# Patient Record
Sex: Female | Born: 1970 | Race: White | Hispanic: No | Marital: Single | Smoking: Never smoker
Health system: Southern US, Community
[De-identification: ages and names within clinical notes are randomized; demographics above are authoritative.]

## PROBLEM LIST (undated history)

## (undated) DIAGNOSIS — J45909 Unspecified asthma, uncomplicated: Secondary | ICD-10-CM

## (undated) DIAGNOSIS — I10 Essential (primary) hypertension: Secondary | ICD-10-CM

---

## 1998-05-17 ENCOUNTER — Emergency Department (HOSPITAL_COMMUNITY): Admission: EM | Admit: 1998-05-17 | Discharge: 1998-05-17 | Payer: Self-pay | Admitting: Emergency Medicine

## 1998-05-31 ENCOUNTER — Emergency Department (HOSPITAL_COMMUNITY): Admission: EM | Admit: 1998-05-31 | Discharge: 1998-05-31 | Payer: Self-pay | Admitting: Emergency Medicine

## 1999-05-19 ENCOUNTER — Emergency Department (HOSPITAL_COMMUNITY): Admission: EM | Admit: 1999-05-19 | Discharge: 1999-05-19 | Payer: Self-pay

## 2001-02-10 ENCOUNTER — Encounter: Payer: Self-pay | Admitting: Emergency Medicine

## 2001-02-10 ENCOUNTER — Emergency Department (HOSPITAL_COMMUNITY): Admission: EM | Admit: 2001-02-10 | Discharge: 2001-02-10 | Payer: Self-pay | Admitting: Emergency Medicine

## 2001-08-30 ENCOUNTER — Emergency Department (HOSPITAL_COMMUNITY): Admission: EM | Admit: 2001-08-30 | Discharge: 2001-08-30 | Payer: Self-pay

## 2002-03-11 ENCOUNTER — Encounter: Payer: Self-pay | Admitting: Emergency Medicine

## 2002-03-11 ENCOUNTER — Emergency Department (HOSPITAL_COMMUNITY): Admission: EM | Admit: 2002-03-11 | Discharge: 2002-03-11 | Payer: Self-pay | Admitting: Emergency Medicine

## 2008-03-01 ENCOUNTER — Emergency Department (HOSPITAL_COMMUNITY): Admission: EM | Admit: 2008-03-01 | Discharge: 2008-03-01 | Payer: Self-pay | Admitting: Emergency Medicine

## 2009-06-26 ENCOUNTER — Emergency Department (HOSPITAL_COMMUNITY): Admission: EM | Admit: 2009-06-26 | Discharge: 2009-06-27 | Payer: Self-pay | Admitting: Emergency Medicine

## 2009-11-06 IMAGING — US US OB COMP LESS 14 WK
1 series · 14 of 28 positions shown · non-contrast
Comparison: none

CLINICAL DATA: Cramping

OBSTETRIC <14 WK ULTRASOUND
TECHNIQUE: Transabdominal ultrasound was performed for evaluation
of the gestation as well as the maternal uterus and adnexal
regions.
A single intrauterine gestational sac with yolk sac and embryonic.
No cardiac activity.
Mean sac diameter equals 2.9 mm.  Estimated gestational age equals
8 weeks 0 days.
Maternal uterus/adnexae:
Normal adnexa.  No subchorionic hemorrhage.

[Series 1: unknown · 0.30mm/px · 14 of 40 slices shown]
[im 2/40]
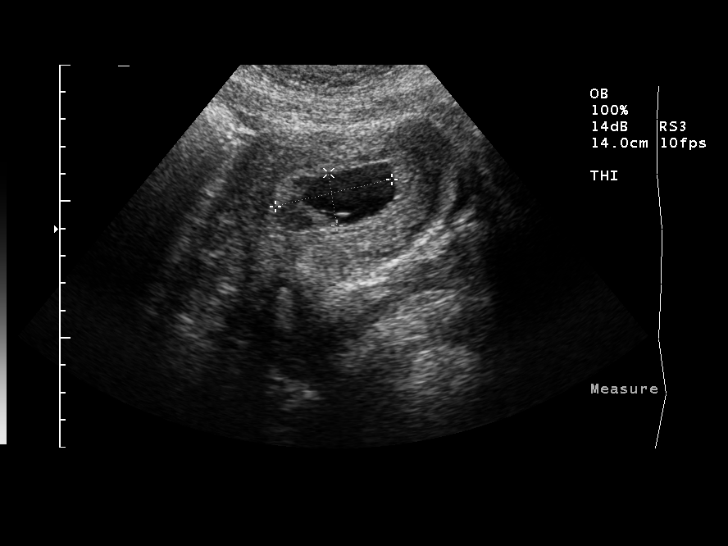
[im 5/40]
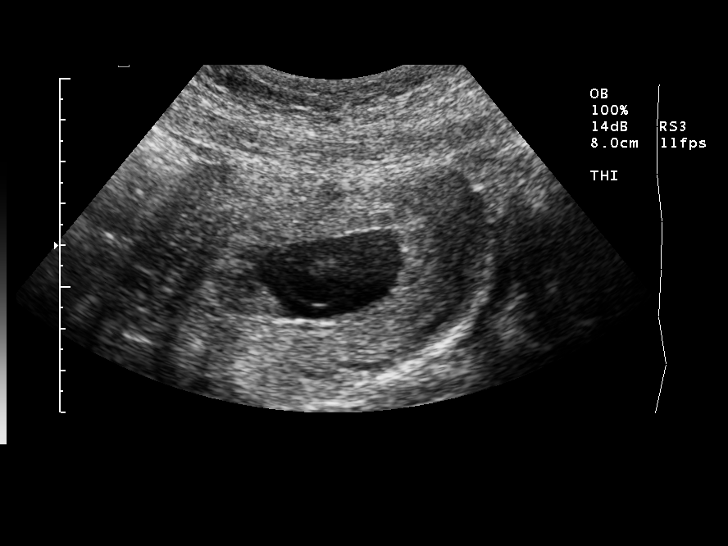
[im 8/40]
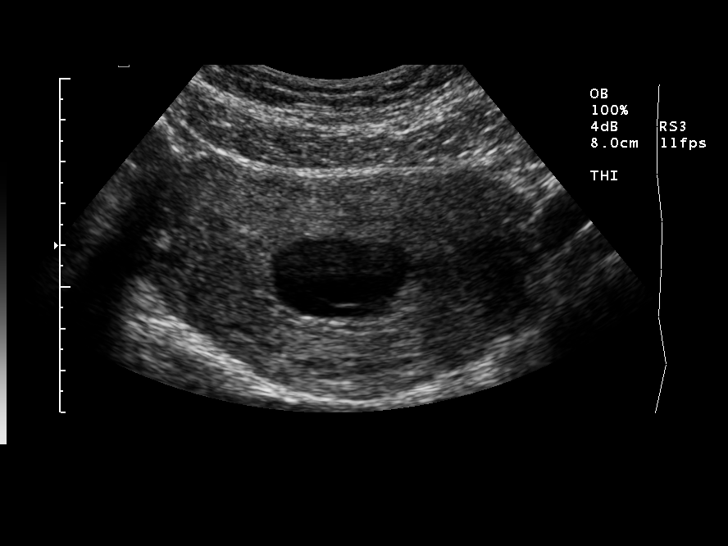
[im 11/40]
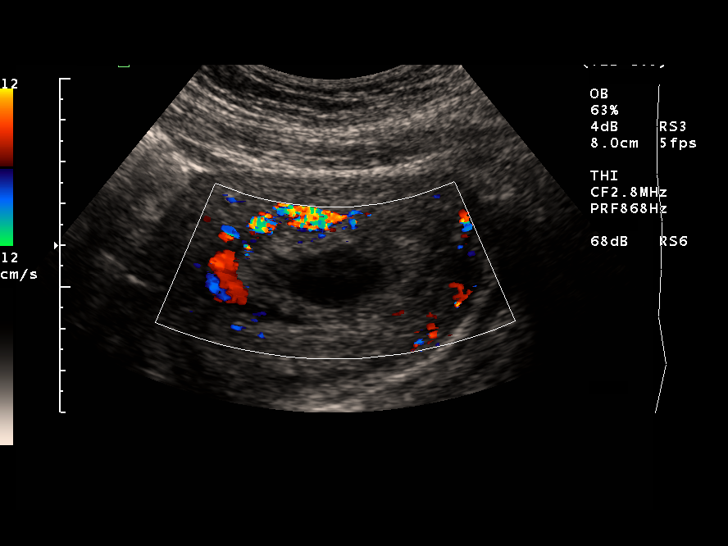
[im 14/40]
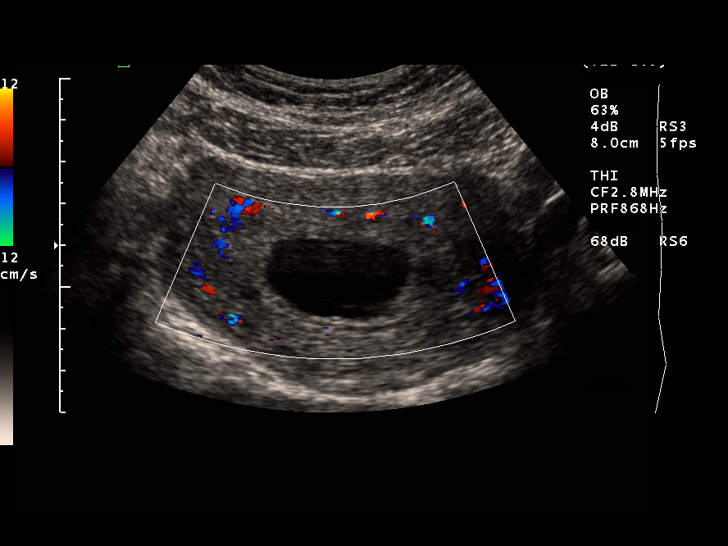
[im 16/40]
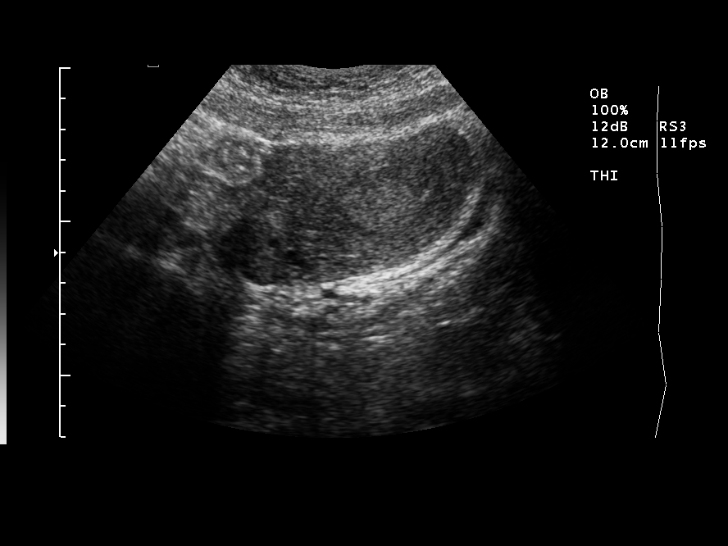
[im 19/40]
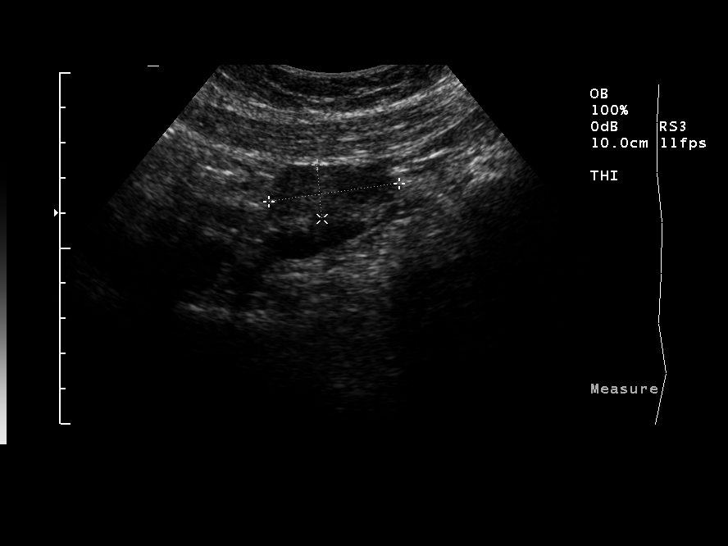
[im 22/40]
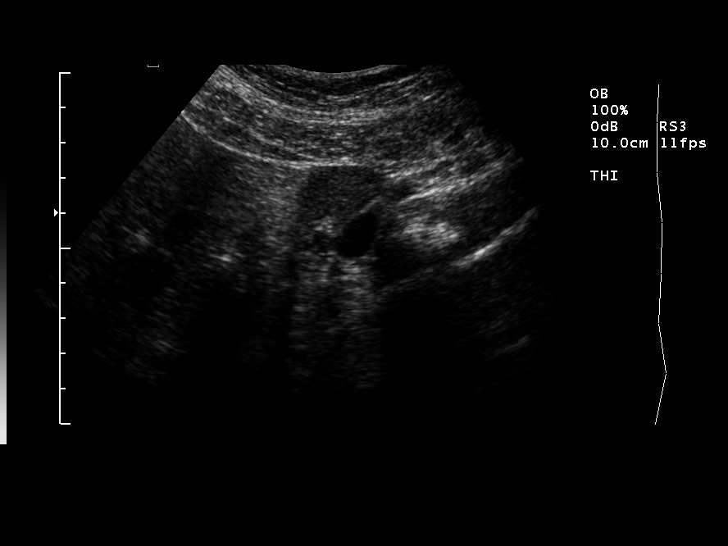
[im 25/40]
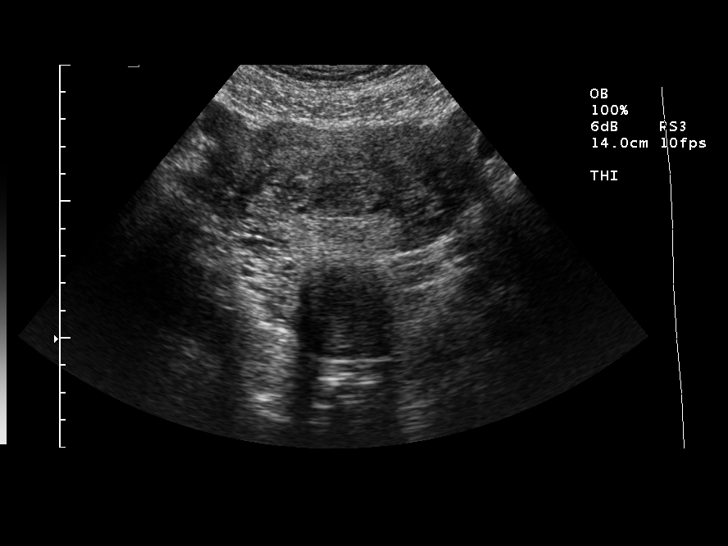
[im 28/40]
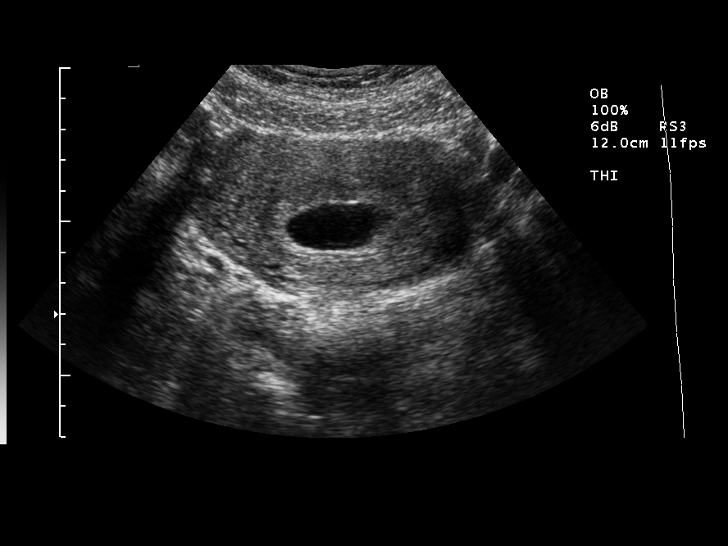
[im 31/40]
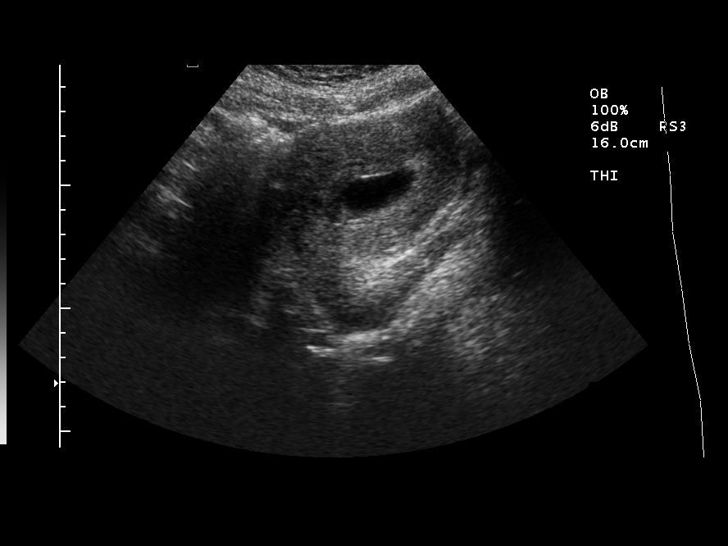
[im 34/40]
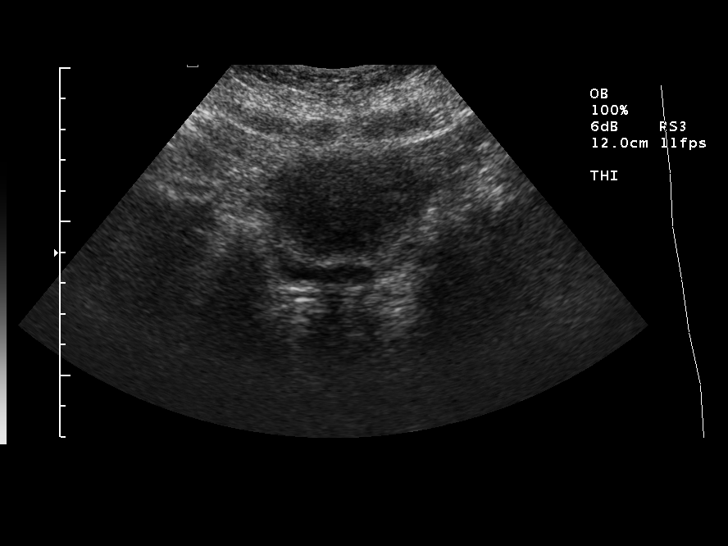
[im 37/40]
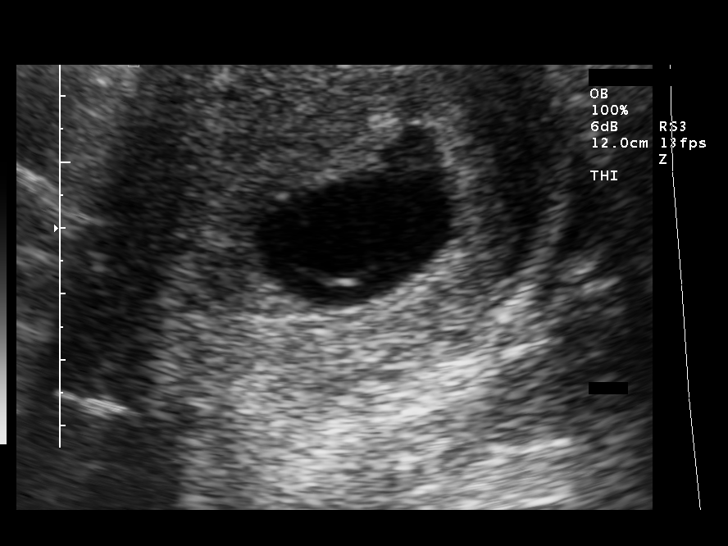
[im 40/40]
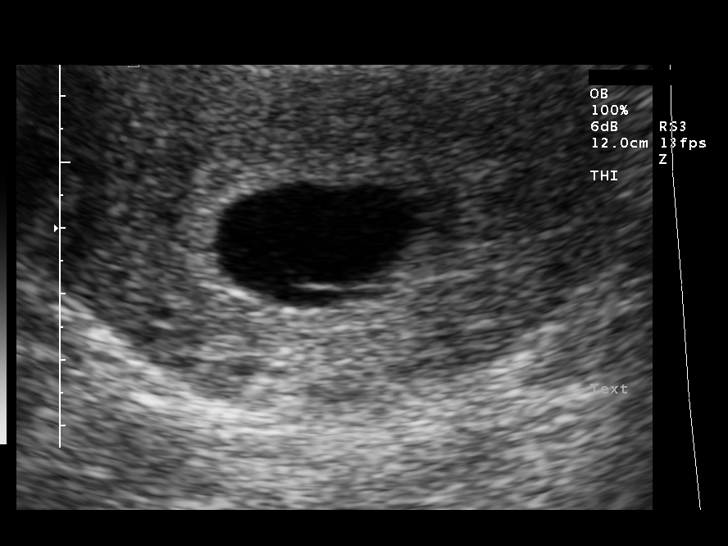

[14 of 28 positions shown; findings below may reference images not displayed]

IMPRESSION: 1.  Single intrauterine gestational sac with embryo.  No cardiac
activity however embryo  too small to detect activity at this time.
Recommend follow-up.

## 2011-03-03 IMAGING — CR DG CHEST 2V
2 series · 2 of 2 positions shown · non-contrast
Comparison: Report dated 03/11/2002.

CLINICAL DATA: Mid chest pain radiating to the back for several
days.

CHEST - 2 VIEW

[w chest pa]
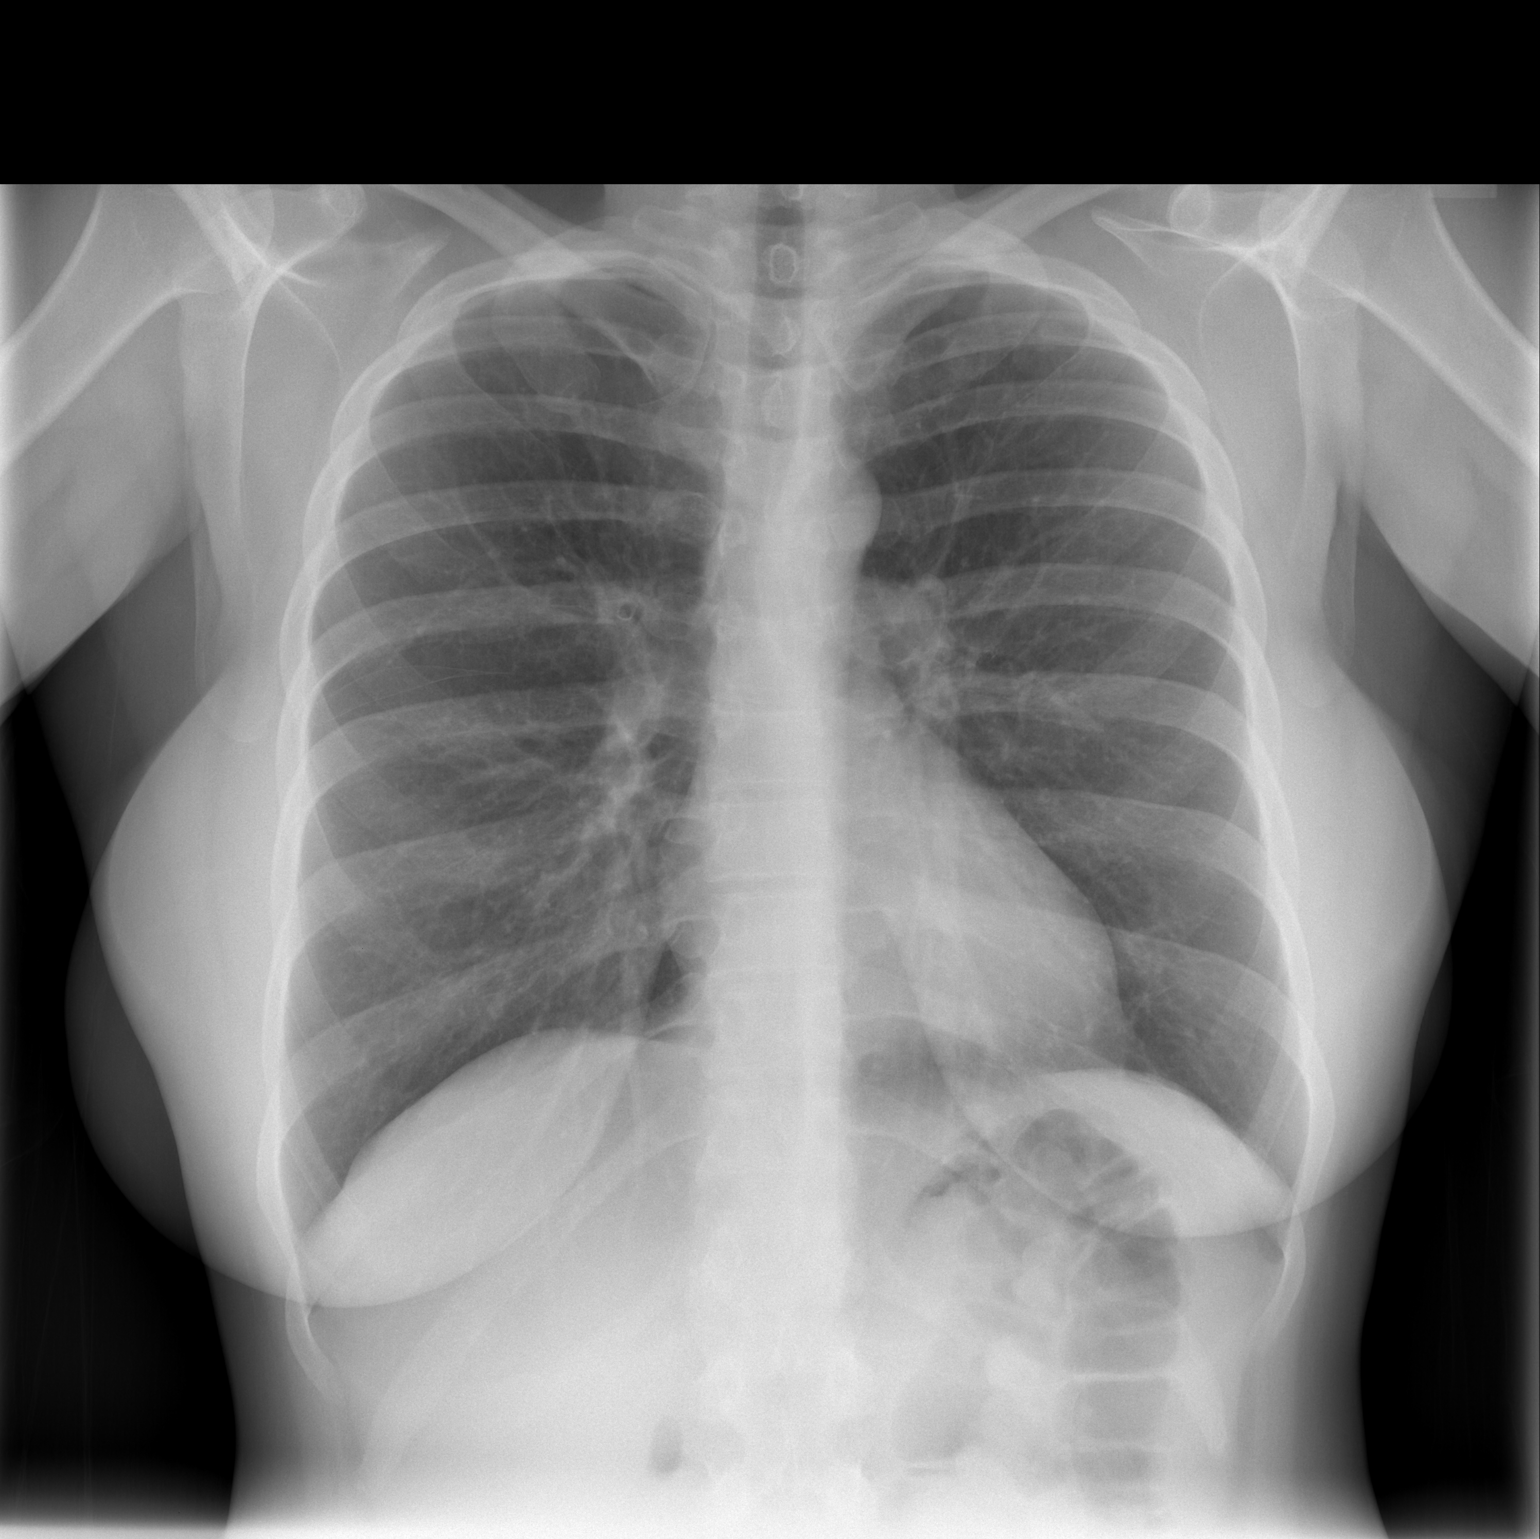

[w chest lat]
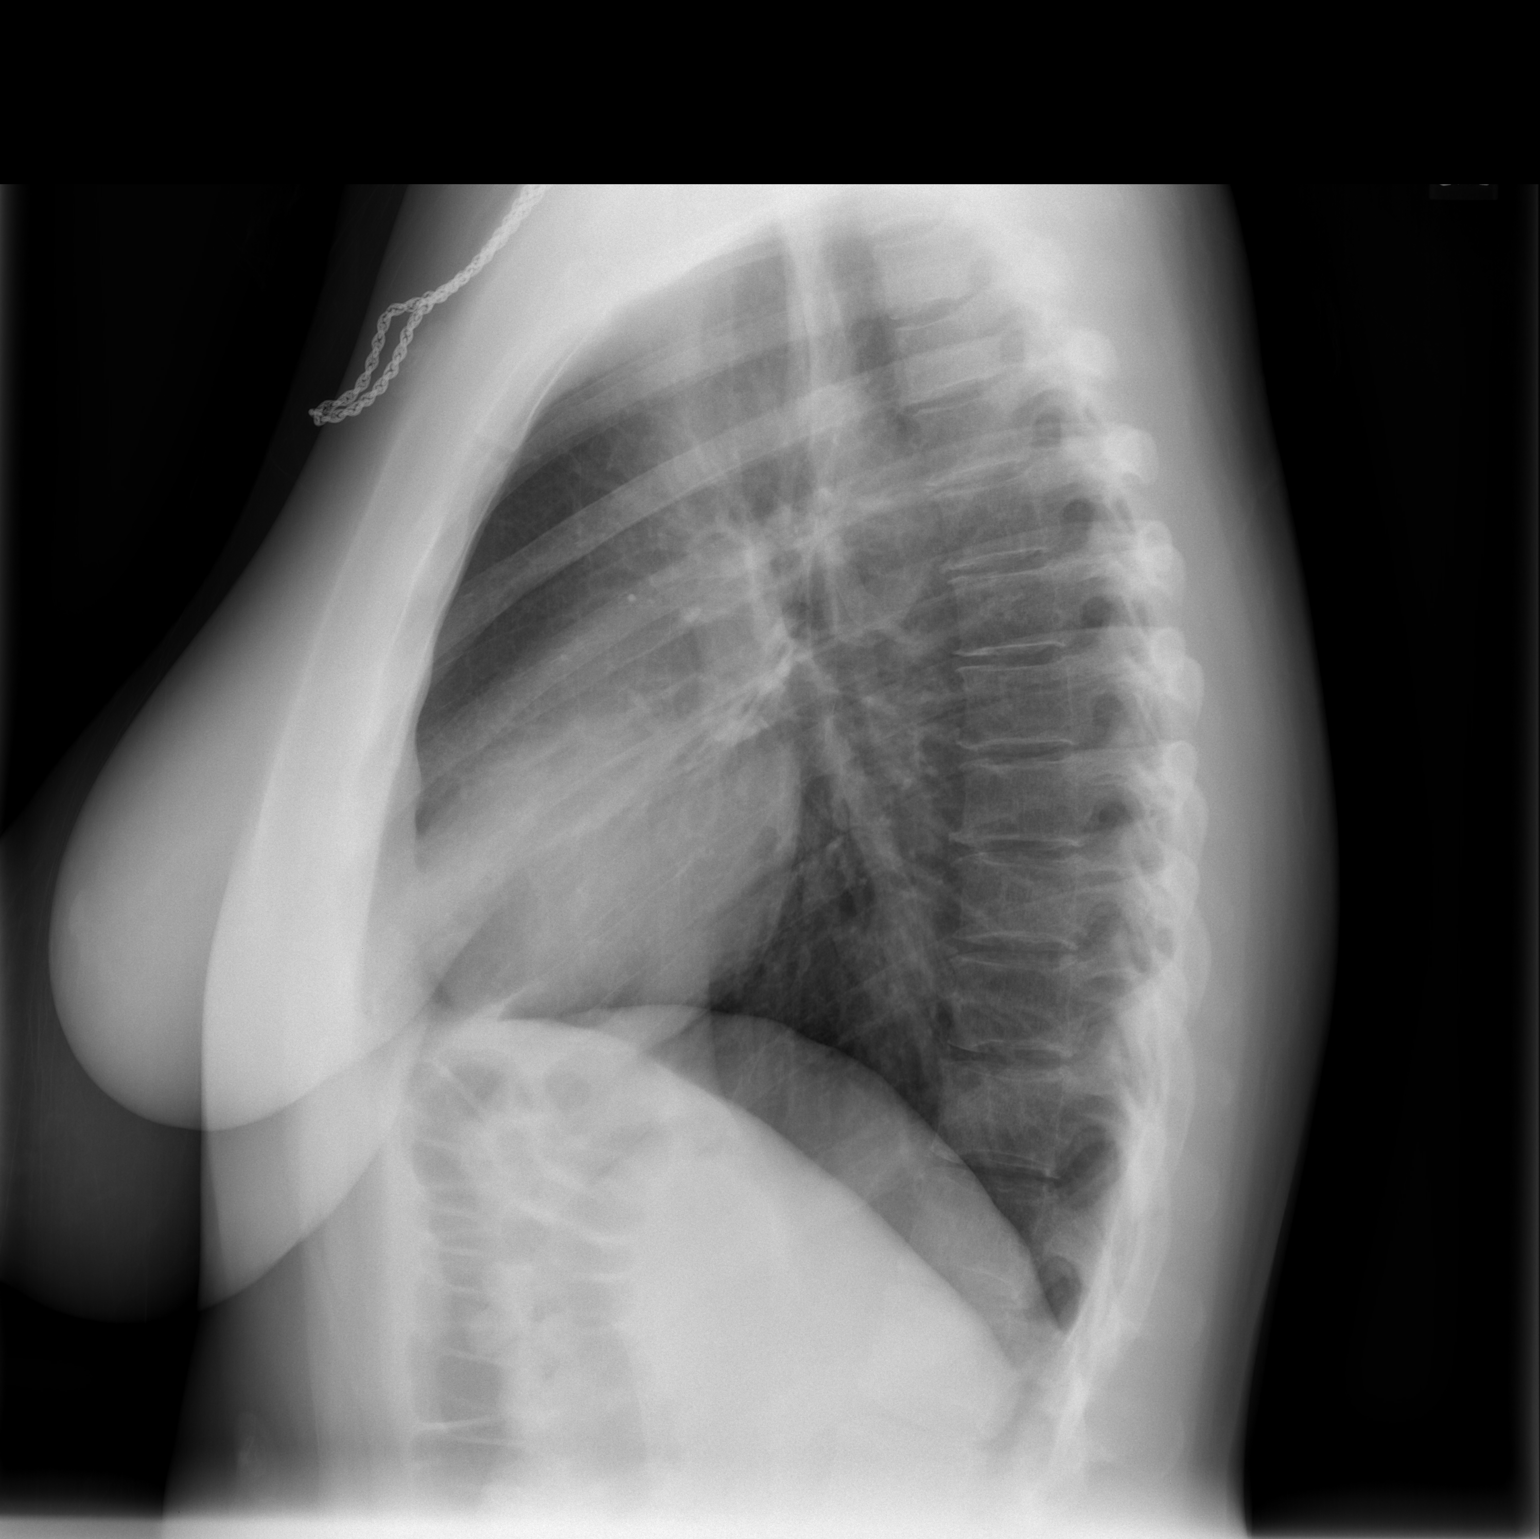

[2 of 2 positions shown; findings below may reference images not displayed]

FINDINGS: Normal sized heart.  Clear lungs.  Mild central
peribronchial thickening.  Mild thoracic spine degenerative
changes.
IMPRESSION: Mild central bronchitic changes.

## 2011-03-04 LAB — POCT I-STAT, CHEM 8
Calcium, Ion: 1.19 mmol/L (ref 1.12–1.32)
Creatinine, Ser: 0.9 mg/dL (ref 0.4–1.2)
Glucose, Bld: 89 mg/dL (ref 70–99)
HCT: 41 % (ref 36.0–46.0)
Hemoglobin: 13.9 g/dL (ref 12.0–15.0)
TCO2: 25 mmol/L (ref 0–100)

## 2011-03-05 LAB — URINALYSIS, ROUTINE W REFLEX MICROSCOPIC
Bilirubin Urine: NEGATIVE
Hgb urine dipstick: NEGATIVE
Ketones, ur: NEGATIVE mg/dL
Nitrite: NEGATIVE
Specific Gravity, Urine: 1.017 (ref 1.005–1.030)
Urobilinogen, UA: 0.2 mg/dL (ref 0.0–1.0)

## 2011-03-05 LAB — URINE MICROSCOPIC-ADD ON

## 2011-08-22 LAB — URINALYSIS, ROUTINE W REFLEX MICROSCOPIC
Glucose, UA: NEGATIVE
Leukocytes, UA: NEGATIVE
Protein, ur: NEGATIVE
Specific Gravity, Urine: 1.021
Urobilinogen, UA: 0.2

## 2011-08-22 LAB — URINE MICROSCOPIC-ADD ON

## 2011-08-22 LAB — WET PREP, GENITAL
Trich, Wet Prep: NONE SEEN
Yeast Wet Prep HPF POC: NONE SEEN

## 2017-02-19 ENCOUNTER — Emergency Department (HOSPITAL_COMMUNITY)
Admission: EM | Admit: 2017-02-19 | Discharge: 2017-02-19 | Disposition: A | Discharge status: Home / Self Care | Payer: Self-pay | Attending: Emergency Medicine | Admitting: Emergency Medicine

## 2017-02-19 ENCOUNTER — Encounter (HOSPITAL_COMMUNITY): Payer: Self-pay | Admitting: Oncology

## 2017-02-19 DIAGNOSIS — Z79899 Other long term (current) drug therapy: Secondary | ICD-10-CM | POA: Insufficient documentation

## 2017-02-19 DIAGNOSIS — R0789 Other chest pain: Secondary | ICD-10-CM | POA: Insufficient documentation

## 2017-02-19 DIAGNOSIS — I10 Essential (primary) hypertension: Secondary | ICD-10-CM | POA: Insufficient documentation

## 2017-02-19 DIAGNOSIS — J45909 Unspecified asthma, uncomplicated: Secondary | ICD-10-CM | POA: Insufficient documentation

## 2017-02-19 HISTORY — DX: Unspecified asthma, uncomplicated: J45.909

## 2017-02-19 HISTORY — DX: Essential (primary) hypertension: I10

## 2017-02-19 LAB — COMPREHENSIVE METABOLIC PANEL
ALBUMIN: 4 g/dL (ref 3.5–5.0)
ALK PHOS: 55 U/L (ref 38–126)
ALT: 23 U/L (ref 14–54)
ANION GAP: 7 (ref 5–15)
AST: 25 U/L (ref 15–41)
BUN: 21 mg/dL — ABNORMAL HIGH (ref 6–20)
CALCIUM: 8.6 mg/dL — AB (ref 8.9–10.3)
CHLORIDE: 108 mmol/L (ref 101–111)
CO2: 23 mmol/L (ref 22–32)
Creatinine, Ser: 0.85 mg/dL (ref 0.44–1.00)
GFR calc non Af Amer: >60 mL/min (ref >60)
GLUCOSE: 102 mg/dL — AB (ref 65–99)
Potassium: 3.3 mmol/L — ABNORMAL LOW (ref 3.5–5.1)
Sodium: 138 mmol/L (ref 135–145)
Total Bilirubin: 0.5 mg/dL (ref 0.3–1.2)
Total Protein: 7.2 g/dL (ref 6.5–8.1)

## 2017-02-19 LAB — CBC WITH DIFFERENTIAL/PLATELET
BASOS PCT: 1 %
Basophils Absolute: 0.1 10*3/uL (ref 0.0–0.1)
EOS PCT: 3 %
Eosinophils Absolute: 0.2 10*3/uL (ref 0.0–0.7)
HEMATOCRIT: 33.2 % — AB (ref 36.0–46.0)
HEMOGLOBIN: 10.2 g/dL — AB (ref 12.0–15.0)
LYMPHS PCT: 28 %
Lymphs Abs: 1.9 10*3/uL (ref 0.7–4.0)
MCH: 21.8 pg — AB (ref 26.0–34.0)
MCHC: 30.7 g/dL (ref 30.0–36.0)
MCV: 71.1 fL — AB (ref 78.0–100.0)
MONO ABS: 0.5 10*3/uL (ref 0.1–1.0)
Monocytes Relative: 7 %
NEUTROS PCT: 61 %
Neutro Abs: 4.1 10*3/uL (ref 1.7–7.7)
PLATELETS: 311 10*3/uL (ref 150–400)
RBC: 4.67 MIL/uL (ref 3.87–5.11)
RDW: 17.3 % — ABNORMAL HIGH (ref 11.5–15.5)
WBC: 6.8 10*3/uL (ref 4.0–10.5)

## 2017-02-19 LAB — RAPID URINE DRUG SCREEN, HOSP PERFORMED
AMPHETAMINES: NOT DETECTED
BARBITURATES: NOT DETECTED
BENZODIAZEPINES: NOT DETECTED
COCAINE: NOT DETECTED
OPIATES: NOT DETECTED
TETRAHYDROCANNABINOL: NOT DETECTED

## 2017-02-19 LAB — SALICYLATE LEVEL

## 2017-02-19 LAB — ETHANOL: Alcohol, Ethyl (B): <5 mg/dL (ref <5)

## 2017-02-19 LAB — I-STAT TROPONIN, ED: Troponin i, poc: 0 ng/mL (ref 0.00–0.08)

## 2017-02-19 LAB — I-STAT BETA HCG BLOOD, ED (MC, WL, AP ONLY): I-stat hCG, quantitative: <5 m[IU]/mL (ref <5)

## 2017-02-19 LAB — ACETAMINOPHEN LEVEL: Acetaminophen (Tylenol), Serum: <10 ug/mL — ABNORMAL LOW (ref 10–30)

## 2017-02-19 MED ORDER — GI COCKTAIL ~~LOC~~
30.0000 mL | Freq: Once | ORAL | Status: AC
  Administered 2017-02-19: 30 mL via ORAL
  Filled 2017-02-19: qty 30

## 2017-02-19 MED ORDER — ONDANSETRON 4 MG PO TBDP
4.0000 mg | ORAL_TABLET | Freq: Once | ORAL | Status: AC
  Administered 2017-02-19: 4 mg via ORAL
  Filled 2017-02-19: qty 1

## 2017-02-19 MED ORDER — POTASSIUM CHLORIDE CRYS ER 20 MEQ PO TBCR
40.0000 meq | EXTENDED_RELEASE_TABLET | Freq: Once | ORAL | Status: AC
  Administered 2017-02-19: 40 meq via ORAL
  Filled 2017-02-19: qty 2

## 2017-02-19 NOTE — ED Notes (Signed)
Patient taking sips of ginger ale with po meds.

## 2017-02-19 NOTE — ED Triage Notes (Signed)
Pt has pressured speech and an orange in a bag that she is requesting to have tested for poisoning.  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  Pt states that she ingested an orange at approximately 1500 yesterday and developed crushing chest pain 30 minutes ago.  Pt reports that her client also c/o crushing chest pain yesterday.  Pt asking multiple times if this writer believes her or if I think she is crazy.  Pt rates her pain 5/10.

## 2017-02-19 NOTE — ED Provider Notes (Signed)
[facility]-EMERGENCY DEPT Provider Note   CSN: [id number] Arrival date & time: 02/19/17  4098     History   Chief Complaint Chief Complaint  Patient presents with  . Suspected Poisoning    HPI Kim Singleton is a 46 y.o. female.  HPI   Kim Singleton is a 46 y.o. female, with a history of HTN and asthma, presenting to the ED with suspected poisoning.  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 Patient states, "My client's daughter is a Emergency planning/management officer and is always leaving fruit, cookies, candy, and other things at my client's house, but she never sticks around to chat or say hi. I think she is trying to poison my client and I got in the middle of it." Patient adds that two days ago her client ate some of the fruit and then began complaining of crushing chest pain and back pain. The client's pain was relieved with an antacid.  Around 0300 this morning patient was laying in bed thinking about the fruit and the client's pain and then began to have pain in the back and into the chest bilaterally. Current pain is generalized across the chest, shoulders, and back, cramping, 5/10, and has improved since onset. When asked to indicate where she has pain she passes her hand over her entire chest, back, and shoulders in broad, sweeping motions. Also endorses nausea.    Brings an orange with her from the same batch that she thinks may be poisoned, asking for it to be tested.  Denies SOB, vomiting/diarrhea, neuro deficits, abdominal pain, or any other complaints.    Past Medical History:  Diagnosis Date  . Asthma   . Hypertension     There are no active problems to display for this patient.   History reviewed. No pertinent surgical history.  OB History    No data available       Home Medications    Prior to Admission medications   Medication Sig Start Date End  Date Taking? Authorizing Provider  Azilsartan Medoxomil (EDARBI) 40 MG TABS Take 40 mg by mouth daily.   Yes Historical Provider, MD  CALCIUM PO Take 1 tablet by mouth daily.   Yes Historical Provider, MD    Family History No family history on file.  Social History Social History  Substance Use Topics  . Smoking status: Never Smoker  . Smokeless tobacco: Never Used  . Alcohol use No     Allergies   Celexa [citalopram hydrobromide]; Lexapro [escitalopram oxalate]; Penicillins; and Valium [diazepam]   Review of Systems Review of Systems  Constitutional: Negative for chills, diaphoresis and fever.  HENT: Negative for facial swelling.   Respiratory: Negative for shortness of breath.   Cardiovascular: Positive for chest pain.  Gastrointestinal: Positive for nausea. Negative for abdominal pain, diarrhea and vomiting.  Musculoskeletal: Positive for back pain. Negative for neck pain.  Skin: Negative for rash.  Neurological: Negative for dizziness, syncope, weakness, light-headedness, numbness and headaches.  All other systems reviewed and are negative.    Physical Exam Updated Vital Signs BP (!) 155/96 (BP Location: Left Arm)   Pulse 77   Temp 97.6 F (36.4 C) (Oral)   Resp 20   Ht 5\' 2"  (1.575 m)   Wt 77.1 kg   LMP 02/12/2017 (Exact Date)   SpO2 100%   BMI 31.09 kg/m   Physical  Exam  Constitutional: She appears well-developed and well-nourished. No distress.  HENT:  Head: Normocephalic and atraumatic.  Mouth/Throat: Oropharynx is clear and moist.  Eyes: Conjunctivae and EOM are normal. Pupils are equal, round, and reactive to light.  Neck: Normal range of motion. Neck supple.  Cardiovascular: Normal rate, regular rhythm, normal heart sounds and intact distal pulses.   Pulmonary/Chest: Effort normal and breath sounds normal. No respiratory distress.  Patient shows no increased work of breathing. Is able to speak multiple sentences in one breath.  Abdominal: Soft.  There is no tenderness. There is no guarding.  Musculoskeletal: She exhibits no edema.  Normal motor function intact in all extremities and spine. No midline spinal tenderness.   Lymphadenopathy:    She has no cervical adenopathy.  Neurological: She is alert.  No sensory deficits. Strength 5/5 in all extremities. No gait disturbance. Coordination intact including heel to shin and finger to nose. Cranial nerves III-XII grossly intact. No facial droop.   Skin: Skin is warm and dry. She is not diaphoretic.  Psychiatric: She has a normal mood and affect. Her behavior is normal.  Nursing note and vitals reviewed.    ED Treatments / Results  Labs (all labs ordered are listed, but only abnormal results are displayed) Labs Reviewed  COMPREHENSIVE METABOLIC PANEL - Abnormal; Notable for the following:       Result Value   Potassium 3.3 (*)    Glucose, Bld 102 (*)    BUN 21 (*)    Calcium 8.6 (*)    All other components within normal limits  CBC WITH DIFFERENTIAL/PLATELET - Abnormal; Notable for the following:    Hemoglobin 10.2 (*)    HCT 33.2 (*)    MCV 71.1 (*)    MCH 21.8 (*)    RDW 17.3 (*)    All other components within normal limits  ACETAMINOPHEN LEVEL - Abnormal; Notable for the following:    Acetaminophen (Tylenol), Serum <10 (*)    All other components within normal limits  ETHANOL  RAPID URINE DRUG SCREEN, HOSP PERFORMED  SALICYLATE LEVEL  I-STAT BETA HCG BLOOD, ED ([facility], [facility], [facility] ONLY)  I-STAT TROPOININ, ED    EKG  EKG Interpretation  Date/Time:  Monday February 19 2017 04:17:33 EDT Ventricular Rate:  67 PR Interval:    QRS Duration: 93 QT Interval:  407 QTC Calculation: 430 R Axis:   18 Text Interpretation:  Sinus rhythm RSR' in V1 or V2, right VCD or RVH Otherwise no significant change Confirmed by Read DriversMOLPUS  MD, Jonny RuizJOHN ([id number]) on 02/19/2017 4:27:24 AM       Radiology No results found.  Procedures Procedures (including critical care time)  Medications Ordered in  ED Medications  gi cocktail (Maalox,Lidocaine,Donnatal) (30 mLs Oral Given 02/19/17 0413)  ondansetron (ZOFRAN-ODT) disintegrating tablet 4 mg (4 mg Oral Given 02/19/17 0413)  potassium chloride SA (K-DUR,KLOR-CON) CR tablet 40 mEq (40 mEq Oral Given 02/19/17 0542)     Initial Impression / Assessment and Plan / ED Course  I have reviewed the triage vital signs and the nursing notes.  Pertinent labs & imaging results that were available during my care of the patient were reviewed by me and considered in my medical decision making (see chart for details).     Patient presents with a complaint of suspicion of poisoning. Patient is nontoxic appearing, afebrile, not tachycardic, not tachypneic, not hypotensive, maintains SPO2 of 99-100% on room air, and is in no apparent distress. Lab work reassuring. Hypokalemia noted  and addressed. Anemia also noted. Patient denies bleeding, SOB, and her chest discomfort resolved while waiting for labs to return. Patient was told that we do not test fruit or any other items for poison here in the ED. Patient's presentation does not seem to be consistent with ingestion of a toxic substance. She was encouraged to follow up with her PCP and, if she deems it necessary, law enforcement. Return precautions discussed. Pt voices understanding of all instructions and is comfortable with discharge.  Findings and plan of care discussed with Paula Libra, MD.   Vitals:   02/19/17 0329 02/19/17 0542  BP: (!) 155/96 132/74  Pulse: 77 64  Resp: 20 18  Temp: 97.6 F (36.4 C) 97.6 F (36.4 C)  TempSrc: Oral Oral  SpO2: 100% 99%  Weight: 77.1 kg   Height: 5\' 2"  (1.575 m)       Final Clinical Impressions(s) / ED Diagnoses   Final diagnoses:  Chest discomfort    New Prescriptions New Prescriptions   No medications on file     Concepcion Living 02/19/17 0612    Paula Libra, MD 02/19/17 [phone]

## 2017-02-19 NOTE — Discharge Instructions (Signed)
The only notable abnormalities on the lab work today were discussed with you. These included a slightly low potassium, which was addressed here in the ED, and evidence of anemia. These lab values should be rechecked by your primary care provider within the next week. Follow-up with your primary care provider for any future management of today's issue. Contact law enforcement should you feel as if someone is trying to harm you.

## 2018-11-09 ENCOUNTER — Other Ambulatory Visit: Payer: Self-pay

## 2018-11-09 ENCOUNTER — Emergency Department
Admission: EM | Admit: 2018-11-09 | Discharge: 2018-11-09 | Disposition: A | Discharge status: Home / Self Care | Payer: BLUE CROSS/BLUE SHIELD | Attending: Emergency Medicine | Admitting: Emergency Medicine

## 2018-11-09 DIAGNOSIS — Z79899 Other long term (current) drug therapy: Secondary | ICD-10-CM | POA: Insufficient documentation

## 2018-11-09 DIAGNOSIS — K047 Periapical abscess without sinus: Secondary | ICD-10-CM | POA: Diagnosis not present

## 2018-11-09 DIAGNOSIS — J45909 Unspecified asthma, uncomplicated: Secondary | ICD-10-CM | POA: Diagnosis not present

## 2018-11-09 DIAGNOSIS — K0889 Other specified disorders of teeth and supporting structures: Secondary | ICD-10-CM | POA: Diagnosis present

## 2018-11-09 DIAGNOSIS — I10 Essential (primary) hypertension: Secondary | ICD-10-CM | POA: Diagnosis not present

## 2018-11-09 MED ORDER — LIDOCAINE VISCOUS HCL 2 % MT SOLN
10.0000 mL | OROMUCOSAL | 0 refills | Status: AC | PRN
Start: 2018-11-09 — End: ?

## 2018-11-09 MED ORDER — CLINDAMYCIN HCL 300 MG PO CAPS: 300.0000 mg | ORAL_CAPSULE | Freq: Three times a day (TID) | ORAL | 0 refills | Status: AC

## 2018-11-09 MED ORDER — ACETAMINOPHEN-CODEINE #3 300-30 MG PO TABS: 1.0000 | ORAL_TABLET | Freq: Four times a day (QID) | ORAL | 0 refills | Status: AC | PRN

## 2018-11-09 NOTE — ED Notes (Signed)
Pt normally goes to her dentist to get antibiotics and pain medication for her recurring abscess but was unable to make an appointment this time. Pt c/o of severe pain on the right side of her mouth.

## 2018-11-09 NOTE — ED Provider Notes (Signed)
[facility] Emergency Department Provider Note  ____________________________________________  Time seen: Approximately 11:22 AM  I have reviewed the triage vital signs and the nursing notes.   HISTORY  Chief Complaint Dental Pain    HPI Kim Singleton is a 47 y.o. female that presents to the emergency department for evaluation of bottom dental pain for 4 days.  Patient states that today pain is throbbing.  She has had a dental abscess in this tooth before.  She was unable to make an appointment with a dentist because she states that it takes several days and payment in order to see them.  She would like a note for work.  No fever, chills.  Past Medical History:  Diagnosis Date  . Asthma   . Hypertension     There are no active problems to display for this patient.   History reviewed. No pertinent surgical history.  Prior to Admission medications   Medication Sig Start Date End Date Taking? Authorizing Provider  acetaminophen-codeine (TYLENOL #3) 300-30 MG tablet Take 1 tablet by mouth every 6 (six) hours as needed for up to 2 days for moderate pain. 11/09/18 11/11/18  Enid DerryWagner, Christal Lagerstrom, PA-C  Azilsartan Medoxomil (EDARBI) 40 MG TABS Take 40 mg by mouth daily.    [provider]  CALCIUM PO Take 1 tablet by mouth daily.    [provider]  clindamycin (CLEOCIN) 300 MG capsule Take 1 capsule (300 mg total) by mouth 3 (three) times daily for 10 days. 11/09/18 11/19/18  Enid DerryWagner, Mabeline Varas, PA-C  lidocaine (XYLOCAINE) 2 % solution Use as directed 10 mLs in the mouth or throat as needed for mouth pain. 11/09/18   Enid DerryWagner, Seferina Brokaw, PA-C    Allergies Celexa [citalopram hydrobromide]; Lexapro [escitalopram oxalate]; Penicillins; and Valium [diazepam]  History reviewed. No pertinent family history.  Social History Social History   Tobacco Use  . Smoking status: Never Smoker  . Smokeless tobacco: Never Used  Substance Use Topics  . Alcohol use:  No  . Drug use: No     Review of Systems  Constitutional: No fever/chills Gastrointestinal: No abdominal pain.  No nausea, no vomiting.  Musculoskeletal: Negative for musculoskeletal pain. Skin: Negative for rash, abrasions, lacerations, ecchymosis. Neurological: Negative for headaches   ____________________________________________   PHYSICAL EXAM:  VITAL SIGNS: ED Triage Vitals [11/09/18 1056]  Enc Vitals Group     BP 128/84     Pulse Rate 87     Resp 18     Temp 98.2 F (36.8 C)     Temp Source Oral     SpO2 97 %     Weight 170 lb (77.1 kg)     Height 5\' 4"  (1.626 m)     Head Circumference      Peak Flow      Pain Score 7     Pain Loc      Pain Edu?      Excl. in GC?      Constitutional: Alert and oriented. Well appearing and in no acute distress. Eyes: Conjunctivae are normal. PERRL. EOMI. Head: Atraumatic. ENT:      Ears:      Nose: No congestion/rhinnorhea.      Mouth/Throat: Mucous membranes are moist.  No visible swelling. Tenderness surrounding tooth #22 and #23 Neck: No stridor.   Cardiovascular: Normal rate, regular rhythm.  Good peripheral circulation. Respiratory: Normal respiratory effort without tachypnea or retractions. Lungs CTAB. Good air entry to the bases with no decreased or absent  breath sounds. Musculoskeletal: Full range of motion to all extremities. No gross deformities appreciated. Neurologic:  Normal speech and language. No gross focal neurologic deficits are appreciated.  Skin:  Skin is warm, dry and intact. No rash noted. Psychiatric: Mood and affect are normal. Speech and behavior are normal. Patient exhibits appropriate insight and judgement.   ____________________________________________   LABS (all labs ordered are listed, but only abnormal results are displayed)  Labs Reviewed - No data to display ____________________________________________  EKG   ____________________________________________  RADIOLOGY  No results  found.  ____________________________________________    PROCEDURES  Procedure(s) performed:    Procedures    Medications - No data to display   ____________________________________________   INITIAL IMPRESSION / ASSESSMENT AND PLAN / ED COURSE  Pertinent labs & imaging results that were available during my care of the patient were reviewed by me and considered in my medical decision making (see chart for details).  Review of the [place] CSRS was performed in accordance of the [facility] prior to dispensing any controlled drugs.   Patient's diagnosis is consistent with dental abscess. Vital signs and exam are reassuring. Patient will be discharged home with prescriptions for clindamycin and a short course of tylenol 3. Patient is allergic to amoxicillin. Patient is to follow up with dentist as directed. Patient is given ED precautions to return to the ED for any worsening or new symptoms.     ____________________________________________  FINAL CLINICAL IMPRESSION(S) / ED DIAGNOSES  Final diagnoses:  Dental infection      NEW MEDICATIONS STARTED DURING THIS VISIT:  ED Discharge Orders         Ordered    acetaminophen-codeine (TYLENOL #3) 300-30 MG tablet  Every 6 hours PRN     11/09/18 1128    lidocaine (XYLOCAINE) 2 % solution  As needed     11/09/18 1128    clindamycin (CLEOCIN) 300 MG capsule  3 times daily     11/09/18 1128              This chart was dictated using voice recognition software/Dragon. Despite best efforts to proofread, errors can occur which can change the meaning. Any change was purely unintentional.    Enid Derry, PA-C 11/09/18 1152    Governor Rooks, MD 11/09/18 [phone]

## 2018-11-09 NOTE — ED Triage Notes (Signed)
Dental pain on R lower side x few days. A&O, ambulatory. No distress noted.

## 2023-11-12 ENCOUNTER — Encounter (HOSPITAL_BASED_OUTPATIENT_CLINIC_OR_DEPARTMENT_OTHER): Payer: Self-pay | Admitting: Emergency Medicine

## 2023-11-12 ENCOUNTER — Emergency Department (HOSPITAL_BASED_OUTPATIENT_CLINIC_OR_DEPARTMENT_OTHER)
Admission: EM | Admit: 2023-11-12 | Discharge: 2023-11-12 | Disposition: A | Discharge status: Home / Self Care | Payer: Self-pay | Attending: Emergency Medicine | Admitting: Emergency Medicine

## 2023-11-12 ENCOUNTER — Other Ambulatory Visit: Payer: Self-pay

## 2023-11-12 DIAGNOSIS — J45909 Unspecified asthma, uncomplicated: Secondary | ICD-10-CM | POA: Insufficient documentation

## 2023-11-12 DIAGNOSIS — I10 Essential (primary) hypertension: Secondary | ICD-10-CM | POA: Insufficient documentation

## 2023-11-12 DIAGNOSIS — R112 Nausea with vomiting, unspecified: Secondary | ICD-10-CM

## 2023-11-12 DIAGNOSIS — M791 Myalgia, unspecified site: Secondary | ICD-10-CM | POA: Insufficient documentation

## 2023-11-12 DIAGNOSIS — Z79899 Other long term (current) drug therapy: Secondary | ICD-10-CM | POA: Insufficient documentation

## 2023-11-12 LAB — CBC
HCT: 44.7 % (ref 36.0–46.0)
Hemoglobin: 14.8 g/dL (ref 12.0–15.0)
MCH: 27.6 pg (ref 26.0–34.0)
MCHC: 33.1 g/dL (ref 30.0–36.0)
MCV: 83.4 fL (ref 80.0–100.0)
Platelets: 254 10*3/uL (ref 150–400)
RBC: 5.36 MIL/uL — ABNORMAL HIGH (ref 3.87–5.11)
RDW: 14.5 % (ref 11.5–15.5)
WBC: 9.1 10*3/uL (ref 4.0–10.5)
nRBC: 0 % (ref 0.0–0.2)

## 2023-11-12 LAB — COMPREHENSIVE METABOLIC PANEL
ALT: 37 U/L (ref 0–44)
AST: 35 U/L (ref 15–41)
Albumin: 4.6 g/dL (ref 3.5–5.0)
Alkaline Phosphatase: 56 U/L (ref 38–126)
Anion gap: 9 (ref 5–15)
BUN: 15 mg/dL (ref 6–20)
CO2: 25 mmol/L (ref 22–32)
Calcium: 9.2 mg/dL (ref 8.9–10.3)
Chloride: 102 mmol/L (ref 98–111)
Creatinine, Ser: 0.71 mg/dL (ref 0.44–1.00)
GFR, Estimated: >60 mL/min (ref >60)
Glucose, Bld: 135 mg/dL — ABNORMAL HIGH (ref 70–99)
Potassium: 3.6 mmol/L (ref 3.5–5.1)
Sodium: 136 mmol/L (ref 135–145)
Total Bilirubin: 0.8 mg/dL (ref <1.2)
Total Protein: 7.6 g/dL (ref 6.5–8.1)

## 2023-11-12 LAB — URINALYSIS, ROUTINE W REFLEX MICROSCOPIC
Bacteria, UA: NONE SEEN
Bilirubin Urine: NEGATIVE
Glucose, UA: NEGATIVE mg/dL
Ketones, ur: NEGATIVE mg/dL
Leukocytes,Ua: NEGATIVE
Nitrite: NEGATIVE
Protein, ur: 30 mg/dL — AB
Specific Gravity, Urine: 1.022 (ref 1.005–1.030)
pH: 7.5 (ref 5.0–8.0)

## 2023-11-12 LAB — PREGNANCY, URINE: Preg Test, Ur: NEGATIVE

## 2023-11-12 LAB — LIPASE, BLOOD: Lipase: 19 U/L (ref 11–51)

## 2023-11-12 MED ORDER — EDARBI 40 MG PO TABS: 40.0000 mg | ORAL_TABLET | Freq: Every day | ORAL | 0 refills | Status: AC

## 2023-11-12 MED ORDER — ONDANSETRON 4 MG PO TBDP
4.0000 mg | ORAL_TABLET | Freq: Three times a day (TID) | ORAL | 0 refills | Status: AC | PRN
Start: 2023-11-12 — End: ?

## 2023-11-12 MED ORDER — ONDANSETRON HCL 4 MG/2ML IJ SOLN
4.0000 mg | Freq: Once | INTRAMUSCULAR | Status: AC | PRN
  Administered 2023-11-12: 4 mg via INTRAVENOUS
  Filled 2023-11-12: qty 2

## 2023-11-12 NOTE — ED Provider Notes (Signed)
[place] EMERGENCY DEPARTMENT AT [facility] Provider Note   CSN: [id number] Arrival date & time: 11/12/23  1604     History  Chief Complaint  Patient presents with   Emesis    Kim Singleton is a 52 y.o. female.  With history of hypertension, asthma presenting to the ED for evaluation of nausea and vomiting.  Symptoms began earlier this morning while eating breakfast at [employer].  She reports 7 episodes of nonbloody, nonbilious emesis.  She was given Zofran in nursing triage and reports significant improvement in her symptoms.  She denies any abdominal pain.  She denies any chest pain or shortness of breath.  No dysuria or urgency or hematuria.  She does report some urinary frequency.  No diarrhea or constipation.  No recent antibiotic use or travel.  She is also concerned about her elevated blood pressure.  States she was on a medication for this 5 years ago but discontinued this 2 years ago due to blood pressure being well-controlled.  She reports some generalized bodyaches.  No fevers.   Emesis      Home Medications Prior to Admission medications   Medication Sig Start Date End Date Taking? Authorizing Provider  ondansetron (ZOFRAN-ODT) 4 MG disintegrating tablet Take 1 tablet (4 mg total) by mouth every 8 (eight) hours as needed for nausea or vomiting. 11/12/23  Yes Anab Vivar, Edsel Petrin, PA-C  Azilsartan Medoxomil (EDARBI) 40 MG TABS Take 1 tablet (40 mg total) by mouth daily. 11/12/23   Zairah Arista, Edsel Petrin, PA-C  CALCIUM PO Take 1 tablet by mouth daily.    [provider]  lidocaine (XYLOCAINE) 2 % solution Use as directed 10 mLs in the mouth or throat as needed for mouth pain. 11/09/18   Enid Derry, PA-C      Allergies    Celexa [citalopram hydrobromide], Lexapro [escitalopram oxalate], Penicillins, and Valium [diazepam]    Review of Systems   Review of Systems  Gastrointestinal:  Positive for nausea and vomiting.  All other systems reviewed and  are negative.   Physical Exam Updated Vital Signs BP (!) 171/99 (BP Location: Left Arm)   Pulse 73   Temp 98.2 F (36.8 C)   Resp 18   SpO2 90%  Physical Exam Vitals and nursing note reviewed.  Constitutional:      General: She is not in acute distress.    Appearance: She is well-developed. She is not ill-appearing, toxic-appearing or diaphoretic.     Comments: Resting comfortably in bed  HENT:     Head: Normocephalic and atraumatic.  Eyes:     Conjunctiva/sclera: Conjunctivae normal.  Cardiovascular:     Rate and Rhythm: Normal rate and regular rhythm.     Heart sounds: No murmur heard. Pulmonary:     Effort: Pulmonary effort is normal. No respiratory distress.     Breath sounds: Normal breath sounds. No wheezing, rhonchi or rales.  Abdominal:     Palpations: Abdomen is soft.     Tenderness: There is no abdominal tenderness. There is no guarding.  Musculoskeletal:        General: No swelling.     Cervical back: Neck supple.  Skin:    General: Skin is warm and dry.     Capillary Refill: Capillary refill takes less than 2 seconds.  Neurological:     Mental Status: She is alert.  Psychiatric:        Mood and Affect: Mood normal.     ED Results / Procedures / Treatments  Labs (all labs ordered are listed, but only abnormal results are displayed) Labs Reviewed  COMPREHENSIVE METABOLIC PANEL - Abnormal; Notable for the following components:      Result Value   Glucose, Bld 135 (*)    All other components within normal limits  CBC - Abnormal; Notable for the following components:   RBC 5.36 (*)    All other components within normal limits  URINALYSIS, ROUTINE W REFLEX MICROSCOPIC - Abnormal; Notable for the following components:   Hgb urine dipstick MODERATE (*)    Protein, ur 30 (*)    All other components within normal limits  LIPASE, BLOOD  PREGNANCY, URINE    EKG None  Radiology No results found.  Procedures Procedures    Medications Ordered in  ED Medications  ondansetron (ZOFRAN) injection 4 mg (4 mg Intravenous Given 11/12/23 1643)    ED Course/ Medical Decision Making/ A&P                                 Medical Decision Making Amount and/or Complexity of Data Reviewed Labs: ordered.  Risk Prescription drug management.  This patient presents to the ED for concern of nausea and vomiting, this involves an extensive number of treatment options, and is a complaint that carries with it a high risk of complications and morbidity. The emergent differential diagnosis for vomiting includes, but is not limited to ACS/MI, DKA, Ischemic bowel, Meningitis, Sepsis, Acute gastric dilation, Adrenal insufficiency, Appendicitis,  Bowel obstruction/ileus, Carbon monoxide poisoning, Cholecystitis, Electrolyte abnormalities, Elevated ICP, Gastric outlet obstruction, Pancreatitis, Ruptured viscus, Biliary colic, Cannabinoid hyperemesis syndrome, Gastritis, Gastroenteritis, Gastroparesis,  Narcotic withdrawal, Peptic ulcer disease, and UTI   My initial workup includes labs, symptom control  Additional history obtained from: Nursing notes from this visit.  I ordered, reviewed and interpreted labs which include: CBC, CMP, lipase, urinalysis, urine pregnancy no leukocytosis or anemia.  No significant electrolyte derangement or kidney dysfunction.  Hyperglycemia 135.  Afebrile, hemodynamically stable.  52 year old female presenting for evaluation of nausea vomiting.  Had 7 episodes of vomiting today which began shortly after eating [employer] this morning.  Symptoms resolved after receiving Zofran in triage.  She denies any abdominal pain.  Lab workup was reassuring.  Overall suspect gastroenteritis.  She was sent prescription for Zofran for her symptoms.  She was also concerned about her blood pressure.  She has not been on any antihypertensives for 2 years due to her blood pressure being well-controlled.  Blood pressure moderately elevated here in the  emergency department.  Was requesting prescription for her antihypertensive.  This was signed.  She was encouraged to follow-up with her primary care provider in 1 week for reevaluation of this.  She was given return precautions.  Stable at discharge.  At this time there does not appear to be any evidence of an acute emergency medical condition and the patient appears stable for discharge with appropriate outpatient follow up. Diagnosis was discussed with patient who verbalizes understanding of care plan and is agreeable to discharge. I have discussed return precautions with patient who verbalizes understanding. Patient encouraged to follow-up with their PCP within 1 week. All questions answered.  Note: Portions of this report may have been transcribed using voice recognition software. Every effort was made to ensure accuracy; however, inadvertent computerized transcription errors may still be present.         Final Clinical Impression(s) / ED Diagnoses Final diagnoses:  Nausea  and vomiting, unspecified vomiting type  Uncontrolled hypertension    Rx / DC Orders ED Discharge Orders          Ordered    Azilsartan Medoxomil (EDARBI) 40 MG TABS  Daily        11/12/23 1801    ondansetron (ZOFRAN-ODT) 4 MG disintegrating tablet  Every 8 hours PRN        11/12/23 1801              Mora Bellman 11/12/23 1815    Benjiman Core, MD 11/12/23 [phone]

## 2023-11-12 NOTE — Discharge Instructions (Addendum)
You have been seen today for your complaint of nausea and vomiting. Your lab work was reassuring. Your discharge medications include your antihypertensive.  Take this as prescribed. Zofran.  This is a nausea medicine.  Take it as needed Follow up with: Your primary care provider in 1 week for reevaluation Please seek immediate medical care if you develop any of the following symptoms: You have pain in your chest, neck, arm, or jaw. You feel extremely weak or you faint. You have persistent vomiting. You have vomit that is bright red or looks like black coffee grounds. You have bloody or black stools (feces) or stools that look like tar. You have a severe headache, a stiff neck, or both. You have severe pain, cramping, or bloating in your abdomen. You have difficulty breathing, or you are breathing very quickly. Your heart is beating very quickly. Your skin feels cold and clammy. You feel confused. You have signs of dehydration, such as: Dark urine, very little urine, or no urine. Cracked lips. Dry mouth. Sunken eyes. Sleepiness. Weakness. At this time there does not appear to be the presence of an emergent medical condition, however there is always the potential for conditions to change. Please read and follow the below instructions.  Do not take your medicine if  develop an itchy rash, swelling in your mouth or lips, or difficulty breathing; call 911 and seek immediate emergency medical attention if this occurs.  You may review your lab tests and imaging results in their entirety on your MyChart account.  Please discuss all results of fully with your primary care provider and other specialist at your follow-up visit.  Note: Portions of this text may have been transcribed using voice recognition software. Every effort was made to ensure accuracy; however, inadvertent computerized transcription errors may still be present.

## 2023-11-12 NOTE — ED Triage Notes (Incomplete)
Vomiting since 3am, headache, diarrhea, and bodyaches

## 2023-11-12 NOTE — ED Notes (Signed)
 RN reviewed discharge instructions with pt. Pt verbalized understanding and had no further questions. VSS upon discharge.
